# Patient Record
Sex: Male | Born: 1975 | Race: White | Hispanic: No | Marital: Single | Smoking: Never smoker
Health system: Southern US, Community
[De-identification: ages and names within clinical notes are randomized; demographics above are authoritative.]

## PROBLEM LIST (undated history)

## (undated) DIAGNOSIS — B351 Tinea unguium: Secondary | ICD-10-CM

## (undated) HISTORY — PX: OTHER SURGICAL HISTORY: SHX169

---

## 2004-06-02 ENCOUNTER — Emergency Department (HOSPITAL_COMMUNITY): Admission: EM | Admit: 2004-06-02 | Discharge: 2004-06-02 | Payer: Self-pay | Admitting: Emergency Medicine

## 2004-09-07 ENCOUNTER — Emergency Department (HOSPITAL_COMMUNITY): Admission: EM | Admit: 2004-09-07 | Discharge: 2004-09-07 | Payer: Self-pay | Admitting: Emergency Medicine

## 2004-10-31 ENCOUNTER — Emergency Department (HOSPITAL_COMMUNITY): Admission: EM | Admit: 2004-10-31 | Discharge: 2004-10-31 | Payer: Self-pay | Admitting: Emergency Medicine

## 2005-06-24 ENCOUNTER — Emergency Department (HOSPITAL_COMMUNITY): Admission: EM | Admit: 2005-06-24 | Discharge: 2005-06-24 | Payer: Self-pay | Admitting: Emergency Medicine

## 2005-07-23 ENCOUNTER — Emergency Department (HOSPITAL_COMMUNITY): Admission: EM | Admit: 2005-07-23 | Discharge: 2005-07-23 | Payer: Self-pay | Admitting: Emergency Medicine

## 2005-07-26 ENCOUNTER — Emergency Department (HOSPITAL_COMMUNITY): Admission: EM | Admit: 2005-07-26 | Discharge: 2005-07-26 | Payer: Self-pay | Admitting: *Deleted

## 2005-12-04 ENCOUNTER — Emergency Department (HOSPITAL_COMMUNITY): Admission: EM | Admit: 2005-12-04 | Discharge: 2005-12-04 | Payer: Self-pay | Admitting: Emergency Medicine

## 2006-01-27 ENCOUNTER — Emergency Department (HOSPITAL_COMMUNITY): Admission: EM | Admit: 2006-01-27 | Discharge: 2006-01-27 | Payer: Self-pay | Admitting: Emergency Medicine

## 2006-05-14 ENCOUNTER — Emergency Department (HOSPITAL_COMMUNITY): Admission: EM | Admit: 2006-05-14 | Discharge: 2006-05-14 | Payer: Self-pay | Admitting: Emergency Medicine

## 2006-12-06 ENCOUNTER — Emergency Department (HOSPITAL_COMMUNITY): Admission: EM | Admit: 2006-12-06 | Discharge: 2006-12-06 | Payer: Self-pay | Admitting: Emergency Medicine

## 2007-03-27 ENCOUNTER — Emergency Department (HOSPITAL_COMMUNITY): Admission: EM | Admit: 2007-03-27 | Discharge: 2007-03-27 | Payer: Self-pay | Admitting: Emergency Medicine

## 2007-03-30 ENCOUNTER — Emergency Department (HOSPITAL_COMMUNITY): Admission: EM | Admit: 2007-03-30 | Discharge: 2007-03-30 | Payer: Self-pay | Admitting: Emergency Medicine

## 2007-06-06 ENCOUNTER — Emergency Department (HOSPITAL_COMMUNITY): Admission: EM | Admit: 2007-06-06 | Discharge: 2007-06-06 | Payer: Self-pay | Admitting: Family Medicine

## 2007-06-28 ENCOUNTER — Emergency Department (HOSPITAL_COMMUNITY): Admission: EM | Admit: 2007-06-28 | Discharge: 2007-06-28 | Payer: Self-pay

## 2008-08-19 ENCOUNTER — Ambulatory Visit: Payer: Self-pay | Admitting: Family Medicine

## 2008-08-19 DIAGNOSIS — R631 Polydipsia: Secondary | ICD-10-CM

## 2008-08-19 DIAGNOSIS — B351 Tinea unguium: Secondary | ICD-10-CM

## 2008-08-19 HISTORY — DX: Tinea unguium: B35.1

## 2008-08-20 LAB — CONVERTED CEMR LAB
Alkaline Phosphatase: 60 units/L (ref 39–117)
Bilirubin, Direct: 0 mg/dL (ref 0.0–0.3)
Total Protein: 6.9 g/dL (ref 6.0–8.3)

## 2008-10-17 ENCOUNTER — Emergency Department (HOSPITAL_COMMUNITY): Admission: EM | Admit: 2008-10-17 | Discharge: 2008-10-17 | Payer: Self-pay | Admitting: Emergency Medicine

## 2009-08-22 ENCOUNTER — Emergency Department (HOSPITAL_COMMUNITY): Admission: EM | Admit: 2009-08-22 | Discharge: 2009-08-22 | Payer: Self-pay | Admitting: Emergency Medicine

## 2010-04-05 ENCOUNTER — Emergency Department (HOSPITAL_COMMUNITY)
Admission: EM | Admit: 2010-04-05 | Discharge: 2010-04-05 | Disposition: A | Discharge status: Home / Self Care | Payer: 59 | Attending: Emergency Medicine | Admitting: Emergency Medicine

## 2010-04-05 DIAGNOSIS — R059 Cough, unspecified: Secondary | ICD-10-CM | POA: Insufficient documentation

## 2010-04-05 DIAGNOSIS — H60399 Other infective otitis externa, unspecified ear: Secondary | ICD-10-CM | POA: Insufficient documentation

## 2010-04-05 DIAGNOSIS — H6690 Otitis media, unspecified, unspecified ear: Secondary | ICD-10-CM | POA: Insufficient documentation

## 2010-04-05 DIAGNOSIS — H9209 Otalgia, unspecified ear: Secondary | ICD-10-CM | POA: Insufficient documentation

## 2010-04-05 DIAGNOSIS — R05 Cough: Secondary | ICD-10-CM | POA: Insufficient documentation

## 2010-04-24 ENCOUNTER — Emergency Department (HOSPITAL_COMMUNITY)
Admission: EM | Admit: 2010-04-24 | Discharge: 2010-04-24 | Disposition: A | Discharge status: Home / Self Care | Payer: 59 | Attending: Emergency Medicine | Admitting: Emergency Medicine

## 2010-04-24 ENCOUNTER — Inpatient Hospital Stay (INDEPENDENT_AMBULATORY_CARE_PROVIDER_SITE_OTHER)
Admission: RE | Admit: 2010-04-24 | Discharge: 2010-04-24 | Disposition: A | Discharge status: Home / Self Care | Payer: 59 | Source: Ambulatory Visit | Attending: Family Medicine | Admitting: Family Medicine

## 2010-04-24 DIAGNOSIS — R55 Syncope and collapse: Secondary | ICD-10-CM | POA: Insufficient documentation

## 2010-04-24 DIAGNOSIS — R42 Dizziness and giddiness: Secondary | ICD-10-CM | POA: Insufficient documentation

## 2010-04-24 LAB — POCT I-STAT, CHEM 8
BUN: 14 mg/dL (ref 6–23)
Calcium, Ion: 1.11 mmol/L — ABNORMAL LOW (ref 1.12–1.32)
Chloride: 104 mEq/L (ref 96–112)
Glucose, Bld: 98 mg/dL (ref 70–99)

## 2010-04-24 LAB — CBC
Hemoglobin: 15.6 g/dL (ref 13.0–17.0)
MCH: 32.1 pg (ref 26.0–34.0)
MCV: 91.8 fL (ref 78.0–100.0)
RBC: 4.86 MIL/uL (ref 4.22–5.81)

## 2010-04-24 LAB — URINALYSIS, ROUTINE W REFLEX MICROSCOPIC
Bilirubin Urine: NEGATIVE
Glucose, UA: NEGATIVE mg/dL
Ketones, ur: NEGATIVE mg/dL
pH: 7.5 (ref 5.0–8.0)

## 2010-12-01 LAB — POCT CARDIAC MARKERS
Myoglobin, poc: 75.7
Troponin i, poc: <0.05

## 2010-12-01 LAB — BASIC METABOLIC PANEL
BUN: 10
CO2: 29
Chloride: 105
Creatinine, Ser: 1.18
GFR calc Af Amer: >60

## 2012-02-02 ENCOUNTER — Ambulatory Visit (INDEPENDENT_AMBULATORY_CARE_PROVIDER_SITE_OTHER): Payer: 59 | Admitting: Family Medicine

## 2012-02-02 ENCOUNTER — Ambulatory Visit: Payer: 59

## 2012-02-02 VITALS — BP 118/74 | HR 67 | Temp 97.9°F | Resp 16 | Ht 68.0 in | Wt 173.0 lb

## 2012-02-02 DIAGNOSIS — R202 Paresthesia of skin: Secondary | ICD-10-CM

## 2012-02-02 DIAGNOSIS — R209 Unspecified disturbances of skin sensation: Secondary | ICD-10-CM

## 2012-02-02 DIAGNOSIS — M549 Dorsalgia, unspecified: Secondary | ICD-10-CM

## 2012-02-02 MED ORDER — IBUPROFEN 600 MG PO TABS: 600.0000 mg | ORAL_TABLET | Freq: Three times a day (TID) | ORAL | Status: DC | PRN

## 2012-02-02 MED ORDER — CYCLOBENZAPRINE HCL 10 MG PO TABS: 10.0000 mg | ORAL_TABLET | Freq: Every evening | ORAL | Status: DC | PRN

## 2012-02-02 MED ORDER — TRAMADOL HCL 50 MG PO TABS: 50.0000 mg | ORAL_TABLET | Freq: Three times a day (TID) | ORAL | Status: DC | PRN

## 2012-02-02 MED ORDER — METHYLPREDNISOLONE 4 MG PO KIT: PACK | ORAL | Status: DC
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
## 2012-02-05 ENCOUNTER — Telehealth: Payer: Self-pay | Admitting: Radiology

## 2012-02-05 NOTE — Telephone Encounter (Signed)
Message copied by Marinus Maw on Sun Feb 05, 2012 11:08 AM ------      Message from: Lenell Antu      Created: Fri Feb 03, 2012  2:23 PM       Can you call the patient to let him know that the area on his hip xrays I was concerned with is normal, His xrays are normal.             Thx,      Dr. Conley Rolls

## 2012-02-05 NOTE — Telephone Encounter (Signed)
Spoke to pt and gave him xray results.

## 2012-02-05 NOTE — Telephone Encounter (Signed)
Left message for pt to call back  °

## 2012-02-06 ENCOUNTER — Telehealth: Payer: Self-pay | Admitting: Radiology

## 2012-02-06 NOTE — Telephone Encounter (Signed)
Message copied by Marinus Maw on Mon Feb 06, 2012 10:49 AM ------      Message from: Lenell Antu      Created: Fri Feb 03, 2012  2:23 PM       Can you call the patient to let him know that the area on his hip xrays I was concerned with is normal, His xrays are normal.             Thx,      Dr. Conley Rolls

## 2012-02-06 NOTE — Telephone Encounter (Signed)
Spoke to pt yesterday and gave xray reports. He understood.

## 2012-02-14 ENCOUNTER — Encounter (HOSPITAL_COMMUNITY): Payer: Self-pay | Admitting: *Deleted

## 2012-02-14 ENCOUNTER — Emergency Department (HOSPITAL_COMMUNITY)
Admission: EM | Admit: 2012-02-14 | Discharge: 2012-02-14 | Disposition: A | Discharge status: Home / Self Care | Payer: 59 | Attending: Emergency Medicine | Admitting: Emergency Medicine

## 2012-02-14 DIAGNOSIS — M5416 Radiculopathy, lumbar region: Secondary | ICD-10-CM

## 2012-02-14 DIAGNOSIS — IMO0002 Reserved for concepts with insufficient information to code with codable children: Secondary | ICD-10-CM | POA: Insufficient documentation

## 2012-02-14 MED ORDER — HYDROCODONE-ACETAMINOPHEN 5-325 MG PO TABS: 1.0000 | ORAL_TABLET | Freq: Four times a day (QID) | ORAL | Status: DC | PRN

## 2012-02-14 MED ORDER — HYDROMORPHONE HCL PF 2 MG/ML IJ SOLN
2.0000 mg | Freq: Once | INTRAMUSCULAR | Status: AC
  Administered 2012-02-14: 2 mg via INTRAMUSCULAR
  Filled 2012-02-14: qty 1

## 2012-02-14 MED ORDER — KETOROLAC TROMETHAMINE 30 MG/ML IJ SOLN
30.0000 mg | Freq: Once | INTRAMUSCULAR | Status: AC
  Administered 2012-02-14: 30 mg via INTRAMUSCULAR
  Filled 2012-02-14: qty 1

## 2012-02-14 MED ORDER — CYCLOBENZAPRINE HCL 10 MG PO TABS: 10.0000 mg | ORAL_TABLET | Freq: Two times a day (BID) | ORAL | Status: DC | PRN

## 2012-02-14 MED ORDER — METHYLPREDNISOLONE 4 MG PO KIT: PACK | ORAL | Status: DC

## 2012-02-14 MED ORDER — ONDANSETRON 4 MG PO TBDP
4.0000 mg | ORAL_TABLET | Freq: Once | ORAL | Status: AC
  Administered 2012-02-14: 4 mg via ORAL
  Filled 2012-02-14: qty 1

## 2012-02-14 MED ORDER — DIPHENHYDRAMINE HCL 25 MG PO CAPS
50.0000 mg | ORAL_CAPSULE | Freq: Once | ORAL | Status: AC
  Administered 2012-02-14: 50 mg via ORAL
  Filled 2012-02-14: qty 2

## 2012-02-14 NOTE — ED Notes (Signed)
Hives noted to right side of abdomen and right arm. Airway intact.
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
## 2012-02-14 NOTE — ED Notes (Signed)
Pt is here with lower back pain that has been bothering him for a few weeks and hurts with movement.

## 2012-09-04 ENCOUNTER — Emergency Department (HOSPITAL_COMMUNITY)
Admission: EM | Admit: 2012-09-04 | Discharge: 2012-09-04 | Disposition: A | Discharge status: Home / Self Care | Payer: 59 | Attending: Emergency Medicine | Admitting: Emergency Medicine

## 2012-09-04 ENCOUNTER — Emergency Department (HOSPITAL_COMMUNITY): Payer: 59

## 2012-09-04 ENCOUNTER — Encounter (HOSPITAL_COMMUNITY): Payer: Self-pay | Admitting: Emergency Medicine

## 2012-09-04 DIAGNOSIS — Z23 Encounter for immunization: Secondary | ICD-10-CM | POA: Insufficient documentation

## 2012-09-04 DIAGNOSIS — T07XXXA Unspecified multiple injuries, initial encounter: Secondary | ICD-10-CM

## 2012-09-04 DIAGNOSIS — S0990XA Unspecified injury of head, initial encounter: Secondary | ICD-10-CM | POA: Insufficient documentation

## 2012-09-04 DIAGNOSIS — IMO0002 Reserved for concepts with insufficient information to code with codable children: Secondary | ICD-10-CM | POA: Insufficient documentation

## 2012-09-04 DIAGNOSIS — S01309A Unspecified open wound of unspecified ear, initial encounter: Secondary | ICD-10-CM | POA: Insufficient documentation

## 2012-09-04 DIAGNOSIS — S0180XA Unspecified open wound of other part of head, initial encounter: Secondary | ICD-10-CM | POA: Insufficient documentation

## 2012-09-04 DIAGNOSIS — S0010XA Contusion of unspecified eyelid and periocular area, initial encounter: Secondary | ICD-10-CM | POA: Insufficient documentation

## 2012-09-04 MED ORDER — HYDROCODONE-ACETAMINOPHEN 5-325 MG PO TABS: 1.0000 | ORAL_TABLET | Freq: Four times a day (QID) | ORAL | Status: DC | PRN

## 2012-09-04 MED ORDER — IBUPROFEN 800 MG PO TABS: 800.0000 mg | ORAL_TABLET | Freq: Three times a day (TID) | ORAL | Status: DC

## 2012-09-04 MED ORDER — CEPHALEXIN 500 MG PO CAPS: 500.0000 mg | ORAL_CAPSULE | Freq: Four times a day (QID) | ORAL | Status: DC

## 2012-09-04 MED ORDER — IBUPROFEN 800 MG PO TABS
800.0000 mg | ORAL_TABLET | Freq: Once | ORAL | Status: AC
  Administered 2012-09-04: 800 mg via ORAL
  Filled 2012-09-04: qty 1

## 2012-09-04 MED ORDER — TETANUS-DIPHTH-ACELL PERTUSSIS 5-2.5-18.5 LF-MCG/0.5 IM SUSP
0.5000 mL | Freq: Once | INTRAMUSCULAR | Status: AC
  Administered 2012-09-04: 0.5 mL via INTRAMUSCULAR
  Filled 2012-09-04: qty 0.5
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
## 2012-09-04 NOTE — ED Notes (Signed)
Patient transported to CT 
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
## 2012-09-10 ENCOUNTER — Encounter (HOSPITAL_COMMUNITY): Payer: Self-pay | Admitting: *Deleted

## 2012-09-10 ENCOUNTER — Emergency Department (HOSPITAL_COMMUNITY)
Admission: EM | Admit: 2012-09-10 | Discharge: 2012-09-10 | Disposition: A | Discharge status: Home / Self Care | Payer: 59 | Attending: Emergency Medicine | Admitting: Emergency Medicine

## 2012-09-10 DIAGNOSIS — Z791 Long term (current) use of non-steroidal anti-inflammatories (NSAID): Secondary | ICD-10-CM | POA: Insufficient documentation

## 2012-09-10 DIAGNOSIS — Z79899 Other long term (current) drug therapy: Secondary | ICD-10-CM | POA: Insufficient documentation

## 2012-09-10 DIAGNOSIS — Z4802 Encounter for removal of sutures: Secondary | ICD-10-CM | POA: Insufficient documentation

## 2012-09-10 MED ORDER — HYDROCODONE-ACETAMINOPHEN 5-325 MG PO TABS: 1.0000 | ORAL_TABLET | ORAL | Status: DC | PRN

## 2012-09-10 MED ORDER — IBUPROFEN 800 MG PO TABS: 800.0000 mg | ORAL_TABLET | Freq: Three times a day (TID) | ORAL | Status: DC
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
## 2012-09-10 NOTE — ED Provider Notes (Signed)
Medical screening examination/treatment/procedure(s) were performed by non-physician practitioner and as supervising physician I was immediately available for consultation/collaboration.   Ashby Dawes, MD 09/10/12 [phone]
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
## 2012-11-14 ENCOUNTER — Ambulatory Visit (INDEPENDENT_AMBULATORY_CARE_PROVIDER_SITE_OTHER): Payer: 59 | Admitting: Family Medicine

## 2012-11-14 VITALS — BP 120/78 | HR 80 | Temp 100.3°F | Resp 18 | Ht 68.5 in | Wt 166.0 lb

## 2012-11-14 DIAGNOSIS — S0512XD Contusion of eyeball and orbital tissues, left eye, subsequent encounter: Secondary | ICD-10-CM

## 2012-11-14 DIAGNOSIS — J189 Pneumonia, unspecified organism: Secondary | ICD-10-CM

## 2012-11-14 DIAGNOSIS — Z5189 Encounter for other specified aftercare: Secondary | ICD-10-CM

## 2012-11-14 MED ORDER — AMOXICILLIN 500 MG PO CAPS: 1000.0000 mg | ORAL_CAPSULE | Freq: Two times a day (BID) | ORAL | Status: DC
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
## 2012-11-14 NOTE — Patient Instructions (Addendum)
Thank you for coming in today  For your orbital contusion, I think that this will continue to improve. Based on the degree of trauma I suspect this is still resolving hematoma and healing bone bruise.  For your cough and fever, I suspect you are developing a pneumonia. We will treat you with amoxicillin. Please stay out of work until 24 hours after your fever resolves.  Pneumonia, Adult Pneumonia is an infection of the lungs.  CAUSES Pneumonia may be caused by bacteria or a virus. Usually, these infections are caused by breathing infectious particles into the lungs (respiratory tract). SYMPTOMS   Cough.  Fever.  Chest pain.  Increased rate of breathing.  Wheezing.  Mucus production. DIAGNOSIS  If you have the common symptoms of pneumonia, your caregiver will typically confirm the diagnosis with a chest X-ray. The X-ray will show an abnormality in the lung (pulmonary infiltrate) if you have pneumonia. Other tests of your blood, urine, or sputum may be done to find the specific cause of your pneumonia. Your caregiver may also do tests (blood gases or pulse oximetry) to see how well your lungs are working. TREATMENT  Some forms of pneumonia may be spread to other people when you cough or sneeze. You may be asked to wear a mask before and during your exam. Pneumonia that is caused by bacteria is treated with antibiotic medicine. Pneumonia that is caused by the influenza virus may be treated with an antiviral medicine. Most other viral infections must run their course. These infections will not respond to antibiotics.  PREVENTION A pneumococcal shot (vaccine) is available to prevent a common bacterial cause of pneumonia. This is usually suggested for:  People over 70 years old.  Patients on chemotherapy.  People with chronic lung problems, such as bronchitis or emphysema.  People with immune system problems. If you are over 65 or have a high risk condition, you may receive the  pneumococcal vaccine if you have not received it before. In some countries, a routine influenza vaccine is also recommended. This vaccine can help prevent some cases of pneumonia.You may be offered the influenza vaccine as part of your care. If you smoke, it is time to quit. You may receive instructions on how to stop smoking. Your caregiver can provide medicines and counseling to help you quit. HOME CARE INSTRUCTIONS   Cough suppressants may be used if you are losing too much rest. However, coughing protects you by clearing your lungs. You should avoid using cough suppressants if you can.  Your caregiver may have prescribed medicine if he or she thinks your pneumonia is caused by a bacteria or influenza. Finish your medicine even if you start to feel better.  Your caregiver may also prescribe an expectorant. This loosens the mucus to be coughed up.  Only take over-the-counter or prescription medicines for pain, discomfort, or fever as directed by your caregiver.  Do not smoke. Smoking is a common cause of bronchitis and can contribute to pneumonia. If you are a smoker and continue to smoke, your cough may last several weeks after your pneumonia has cleared.  A cold steam vaporizer or humidifier in your room or home may help loosen mucus.  Coughing is often worse at night. Sleeping in a semi-upright position in a recliner or using a couple pillows under your head will help with this.  Get rest as you feel it is needed. Your body will usually let you know when you need to rest. SEEK IMMEDIATE MEDICAL CARE IF:  Your illness becomes worse. This is especially true if you are elderly or weakened from any other disease.  You cannot control your cough with suppressants and are losing sleep.  You begin coughing up blood.  You develop pain which is getting worse or is uncontrolled with medicines.  You have a fever.  Any of the symptoms which initially brought you in for treatment are getting  worse rather than better.  You develop shortness of breath or chest pain. MAKE SURE YOU:   Understand these instructions.  Will watch your condition.  Will get help right away if you are not doing well or get worse. Document Released: 02/07/2005 Document Revised: 05/02/2011 Document Reviewed: 04/29/2010 [facility] Patient Information 2014 [place], [place].

## 2012-11-15 NOTE — Progress Notes (Signed)
Patient discussed with Dr. Voss. Agree with assessment and plan of care per her note.   

## 2012-11-22 ENCOUNTER — Telehealth: Payer: Self-pay

## 2012-11-22 NOTE — Telephone Encounter (Signed)
Patient is calling to check the status of getting his medical records.   [phone]

## 2012-11-23 NOTE — Telephone Encounter (Signed)
Records faxed to patient on 11/22/12 as he requested. If he needs anything else he was instructed to call me back.

## 2013-03-27 ENCOUNTER — Emergency Department (HOSPITAL_COMMUNITY)
Admission: EM | Admit: 2013-03-27 | Discharge: 2013-03-27 | Disposition: A | Discharge status: Home / Self Care | Payer: 59 | Attending: Emergency Medicine | Admitting: Emergency Medicine

## 2013-03-27 ENCOUNTER — Encounter (HOSPITAL_COMMUNITY): Payer: Self-pay | Admitting: Emergency Medicine

## 2013-03-27 DIAGNOSIS — H53149 Visual discomfort, unspecified: Secondary | ICD-10-CM | POA: Insufficient documentation

## 2013-03-27 DIAGNOSIS — H109 Unspecified conjunctivitis: Secondary | ICD-10-CM

## 2013-03-27 MED ORDER — TETRACAINE HCL 0.5 % OP SOLN
2.0000 [drp] | Freq: Once | OPHTHALMIC | Status: AC
  Administered 2013-03-27: 2 [drp] via OPHTHALMIC
  Filled 2013-03-27: qty 2

## 2013-03-27 MED ORDER — CIPROFLOXACIN HCL 0.3 % OP SOLN
2.0000 [drp] | OPHTHALMIC | Status: DC
  Administered 2013-03-27: 2 [drp] via OPHTHALMIC
  Filled 2013-03-27: qty 2.5

## 2013-03-27 NOTE — ED Notes (Signed)
Pt reports he wears contacts, used his gfs contact case and solution. Monday wore contacts and felt like he had a rip in contact of right eye. Tuesday he did not wear contacts, right eye very red. At present reddness in right eye only. Reports intermittment pain 5/10. States "feels like something is in the corner" inner canthus.

## 2013-03-27 NOTE — Discharge Instructions (Signed)

## 2013-03-27 NOTE — ED Notes (Signed)
Pt normally wears contacts but does not have them in at this time because of irritation to R eye.

## 2013-03-27 NOTE — ED Provider Notes (Signed)
CSN: [id number]     Arrival date & time 03/27/13  1727 History  This chart was scribed for non-physician practitioner, Izola Price. Marisue Humble, PA-C working with Shon Baton, MD by Greggory Stallion, ED scribe. This patient was seen in room WTR7/WTR7 and the patient's care was started at 6:28 PM.   Chief Complaint  Patient presents with  . Eye Pain   The history is provided by the patient. No language interpreter was used.   HPI Comments: Kevin Meyers is a 38 y.o. male who presents to the Emergency Department complaining of gradual onset, intermittent right eye pain and constant redness that started yesterday. Pt also has photophobia. He states he used his girlfriend's contact case and solution to put his contacts in 2 days ago and states the right contact looked ripped. Pt has not put the contacts back in since 2 days ago. He states his eye was really swollen yesterday but it was relieved after sitting in the dark for several hours. Pt states it still feels like something is in the corner of his eye.   History reviewed. No pertinent past medical history. History reviewed. No pertinent past surgical history. Family History  Problem Relation Age of Onset  . Diabetes Mother   . Hypertension Father    History  Substance Use Topics  . Smoking status: Never Smoker   . Smokeless tobacco: Not on file  . Alcohol Use: Yes    Review of Systems  Eyes: Positive for photophobia, pain and redness.  All other systems reviewed and are negative.   Allergies  Review of patient's allergies indicates no known allergies.  Home Medications  No current outpatient prescriptions on file.  BP 147/96  Pulse 70  Temp(Src) 99.1 F (37.3 C) (Oral)  Resp 16  SpO2 95%  Physical Exam  Nursing note and vitals reviewed. Constitutional: He is oriented to person, place, and time. He appears well-developed and well-nourished. No distress.  HENT:  Head: Normocephalic and atraumatic.  Mouth/Throat:  Oropharynx is clear and moist. No oropharyngeal exudate.  Eyes: EOM are normal. Pupils are equal, round, and reactive to light. Right eye exhibits no chemosis and no discharge. Left eye exhibits no chemosis and no discharge. Right conjunctiva is injected. Right conjunctiva has no hemorrhage. Left conjunctiva is not injected. Left conjunctiva has no hemorrhage. No scleral icterus. Right eye exhibits normal extraocular motion and no nystagmus. Left eye exhibits normal extraocular motion and no nystagmus.  Slit lamp exam:      The right eye shows no corneal abrasion, no corneal flare, no corneal ulcer, no fluorescein uptake and no anterior chamber bulge.       The left eye shows no corneal abrasion, no corneal flare, no corneal ulcer, no fluorescein uptake and no anterior chamber bulge.  IOP in right eye 2/1  Pulmonary/Chest: Effort normal.  Musculoskeletal: Normal range of motion. He exhibits no edema and no tenderness.  Neurological: He is alert and oriented to person, place, and time. He exhibits normal muscle tone. Coordination normal.  Skin: Skin is warm and dry. No rash noted. No erythema. No pallor.  Psychiatric: He has a normal mood and affect. His behavior is normal. Judgment and thought content normal.    ED Course  Procedures (including critical care time)  DIAGNOSTIC STUDIES: Oxygen Saturation is 95% on RA, adequate by my interpretation.    COORDINATION OF CARE: 6:30 PM-Discussed treatment plan which includes check for corneal abrasion or ulcer and checking eye pressure with pt  at bedside and pt agreed to plan.  6:36 PM-Advised pt that his symptoms are consistent with conjunctivitis and he will be given eye drops. Pt agrees with plan.    Labs Review Labs Reviewed - No data to display Imaging Review No results found.  EKG Interpretation   None       MDM  Conjunctivitis  Patient here with right eye pain and redness after using someone else's contact case, no evidence of  abrasion, acute narrow angle glaucoma, started on cipro drops - will be following up with eye doctor tomorrow  I personally performed the services described in this documentation, which was scribed in my presence. The recorded information has been reviewed and is accurate.   Izola PriceFrances C. Marisue HumbleSanford, New JerseyPA-C 03/27/13 1932

## 2013-03-28 NOTE — ED Provider Notes (Signed)
Medical screening examination/treatment/procedure(s) were performed by non-physician practitioner and as supervising physician I was immediately available for consultation/collaboration.  EKG Interpretation   None         Staysha Truby F Twania Bujak, MD 03/28/13 0039 
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
## 2014-01-23 ENCOUNTER — Encounter (HOSPITAL_COMMUNITY): Payer: Self-pay

## 2014-01-23 ENCOUNTER — Emergency Department (HOSPITAL_COMMUNITY)
Admission: EM | Admit: 2014-01-23 | Discharge: 2014-01-23 | Disposition: A | Discharge status: Home / Self Care | Payer: 59 | Attending: Emergency Medicine | Admitting: Emergency Medicine

## 2014-01-23 DIAGNOSIS — N499 Inflammatory disorder of unspecified male genital organ: Secondary | ICD-10-CM | POA: Diagnosis present

## 2014-01-23 DIAGNOSIS — L739 Follicular disorder, unspecified: Secondary | ICD-10-CM | POA: Diagnosis not present

## 2014-01-23 MED ORDER — CEPHALEXIN 500 MG PO CAPS: 500.0000 mg | ORAL_CAPSULE | Freq: Two times a day (BID) | ORAL | Status: DC

## 2014-01-23 NOTE — ED Notes (Signed)
Pt presents with c/o possible ingrown hair/abscess under his waistband right above his pubis area. Pt reports some swelling and redness to the area, no drainage noted at this time.

## 2014-01-23 NOTE — ED Provider Notes (Signed)
CSN: [id number]     Arrival date & time 01/23/14  [id number] History  This chart was scribed for non-physician practitioner working with [name], MD by Richarda Overlieichard Holland, ED Scribe. This patient was seen in room WTR9/WTR9 and the patient's care was started at 8:36 PM.  Chief Complaint  Patient presents with  . Abscess    The history is provided by the patient. No language interpreter was used.   HPI Comments: Kevin Meyers is a 38 y.o. male who presents to the Emergency Department complaining of a worsening abscess under his waistband right above his pubis area that started 5 days ago. Pt reports associated redness and says the area does not hurt unless he touches it. He states he thought it was an ingrown hair and says he has been squeezing and tweezing the area. Pt states it "feels like there is a lump." Pt states he has tried nothing else for the abscess. No other modifying factors He denies fever, drainage, and difficulty urinating. He reports no NKDA.    History reviewed. No pertinent past medical history. History reviewed. No pertinent past surgical history. Family History  Problem Relation Age of Onset  . Diabetes Mother   . Hypertension Father    History  Substance Use Topics  . Smoking status: Never Smoker   . Smokeless tobacco: Not on file  . Alcohol Use: Yes     Comment: 4 x a week     Review of Systems  Constitutional: Negative for fever.  Genitourinary: Negative for difficulty urinating.  Skin: Positive for color change and rash.  All other systems reviewed and are negative.   Allergies  Review of patient's allergies indicates no known allergies.  Home Medications   Prior to Admission medications   Medication Sig Start Date End Date Taking? Authorizing Provider  cephALEXin (KEFLEX) 500 MG capsule Take 1 capsule (500 mg total) by mouth 2 (two) times daily. 01/23/14   Earle GellBenjamin W Rydan Gulyas, PA-C  ibuprofen (ADVIL,MOTRIN) 200 MG tablet Take 800 mg by mouth every  6 (six) hours as needed for headache.    Historical Provider, MD   BP 132/93 mmHg  Pulse 75  Temp(Src) 98 F (36.7 C) (Oral)  Resp 20  SpO2 100% Physical Exam  Constitutional: He is oriented to person, place, and time. He appears well-developed and well-nourished.  HENT:  Head: Normocephalic and atraumatic.  Neck: Normal range of motion. Neck supple. No tracheal deviation present.  Cardiovascular: Normal rate, regular rhythm and normal heart sounds.   Pulmonary/Chest: Effort normal and breath sounds normal. No respiratory distress.  Abdominal: Soft. He exhibits no distension. There is no tenderness.  Genitourinary:     Pubic area --Small 1 cm circumferential lesion with mild erythema without drainage. No evidence of fluctuance or any drainable abscess.  Neurological: He is alert and oriented to person, place, and time.  Skin: Skin is warm and dry.  Psychiatric: He has a normal mood and affect.  Nursing note and vitals reviewed.   ED Course  Procedures   DIAGNOSTIC STUDIES: Oxygen Saturation is 100% on RA, normal by my interpretation.    COORDINATION OF CARE: 8:43 PM Discussed treatment plan with pt at bedside and pt agreed to plan.   Labs Review Labs Reviewed - No data to display  Imaging Review No results found.   EKG Interpretation None      MDM  Vitals stable - WNL -afebrile Pt resting comfortably in ED. PE--not concerning for other acute or emergent  pathology. On physical exam, area consistent with folliculitis  Will DC with antibiotic as well as symptomatic support with warm compresses. Discussed f/u with PCP and return precautions, pt very amenable to plan. Patient stable, in good condition and is appropriate for discharge.   Final diagnoses:  Folliculitis    I personally performed the services described in this documentation, which was scribed in my presence. The recorded information has been reviewed and is accurate.      Earle GellBenjamin W Sopertonartner,  PA-C 01/23/14 [id number]  [name], MD 01/24/14 [phone]

## 2014-01-23 NOTE — Discharge Instructions (Signed)

## 2014-09-02 ENCOUNTER — Ambulatory Visit (INDEPENDENT_AMBULATORY_CARE_PROVIDER_SITE_OTHER): Payer: Worker's Compensation | Admitting: Emergency Medicine

## 2014-09-02 VITALS — BP 130/86 | HR 81 | Temp 98.0°F | Resp 16 | Ht 68.0 in | Wt 174.4 lb

## 2014-09-02 DIAGNOSIS — S61011A Laceration without foreign body of right thumb without damage to nail, initial encounter: Secondary | ICD-10-CM

## 2014-09-02 DIAGNOSIS — M79644 Pain in right finger(s): Secondary | ICD-10-CM | POA: Diagnosis not present

## 2014-09-02 NOTE — Patient Instructions (Signed)

## 2014-09-02 NOTE — Progress Notes (Signed)
Subjective:  Patient ID: Kevin Meyers, male    DOB: 1975-09-01  Age: 39 y.o. MRN: [id number]  CC: Laceration   HPI Kevin Meyers presents  with an injury to his right thumb. 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 Current on tetanus. He has no other complaint. Of pain  History Kevin Meyers has no past medical history on file.   He has no past surgical history on file.   His  family history includes Diabetes in his mother; Hypertension in his father.  He   reports that he has never smoked. He does not have any smokeless tobacco history on file. He reports that he drinks alcohol. He reports that he does not use illicit drugs.  Outpatient Prescriptions Prior to Visit  Medication Sig Dispense Refill  . cephALEXin (KEFLEX) 500 MG capsule Take 1 capsule (500 mg total) by mouth 2 (two) times daily. (Patient not taking: Reported on 09/02/2014) 20 capsule 0  . ibuprofen (ADVIL,MOTRIN) 200 MG tablet Take 800 mg by mouth every 6 (six) hours as needed for headache.     No facility-administered medications prior to visit.    History   Social History  . Marital Status: Single    Spouse Name: N/A  . Number of Children: N/A  . Years of Education: N/A   Social History Main Topics  . Smoking status: Never Smoker   . Smokeless tobacco: Not on file  . Alcohol Use: Yes     Comment: 4 x a week   . Drug Use: No  . Sexual Activity: Not on file   Other Topics Concern  . None   Social History Narrative     Review of Systems  Constitutional: Negative for fever, chills and appetite change.  HENT: Negative for congestion, ear pain, postnasal drip, sinus pressure and sore throat.   Eyes: Negative for pain and redness.  Respiratory: Negative for cough, shortness of breath and wheezing.   Cardiovascular: Negative for leg swelling.  Gastrointestinal: Negative for nausea, vomiting, abdominal pain, diarrhea,  constipation and blood in stool.  Endocrine: Negative for polyuria.  Genitourinary: Negative for dysuria, urgency, frequency and flank pain.  Musculoskeletal: Negative for gait problem.  Skin: Negative for rash.  Neurological: Negative for weakness and headaches.  Psychiatric/Behavioral: Negative for confusion and decreased concentration. The patient is not nervous/anxious.     Objective:  BP 130/86 mmHg  Pulse 81  Temp(Src) 98 F (36.7 C) (Oral)  Resp 16  Ht 5\' 8"  (1.727 m)  Wt 174 lb 6.4 oz (79.107 kg)  BMI 26.52 kg/m2  SpO2 98%  Physical Exam  Constitutional: He is oriented to person, place, and time. He appears well-developed and well-nourished.  HENT:  Head: Normocephalic and atraumatic.  Eyes: Conjunctivae are normal. Pupils are equal, round, and reactive to light.  Pulmonary/Chest: Effort normal.  Musculoskeletal: He exhibits no edema.  Neurological: He is alert and oriented to person, place, and time.  Skin: Skin is dry.  Psychiatric: He has a normal mood and affect. His behavior is normal. Thought content normal.   Physical 1.5 cm laceration of the tip of his thumb. The nail and nailbed are not involved. Is no neurovascular tendon injury there is no visible foreign body.  Assessment & Plan:   Kevin Meyers was seen today for laceration.  Diagnoses and all orders for this visit:  Laceration of thumb, right, initial encounter  Pain  of finger of right hand   I have discontinued Mr. Hazen ibuprofen and cephALEXin.  No orders of the defined types were placed in this encounter.   Wound was prepped and draped single fashion and the wound infiltrated with 1% lidocaine. After suitable anesthesia was repaired in a single layer with simple interrupted sutures. He was instructed to come back in a week.  Appropriate red flag conditions were discussed with the patient as well as actions that should be taken.  Patient expressed his understanding.  Follow-up: Return in  about 1 week (around 09/09/2014).  Carmelina Dane, MD

## 2014-09-04 ENCOUNTER — Ambulatory Visit (INDEPENDENT_AMBULATORY_CARE_PROVIDER_SITE_OTHER): Payer: Worker's Compensation | Admitting: Physician Assistant

## 2014-09-04 VITALS — BP 118/80 | HR 77 | Temp 98.3°F | Resp 14 | Ht 68.0 in | Wt 174.0 lb

## 2014-09-04 DIAGNOSIS — S61011S Laceration without foreign body of right thumb without damage to nail, sequela: Secondary | ICD-10-CM | POA: Diagnosis not present

## 2014-09-04 MED ORDER — CEPHALEXIN 500 MG PO CAPS: 500.0000 mg | ORAL_CAPSULE | Freq: Two times a day (BID) | ORAL | Status: DC

## 2014-09-04 NOTE — Patient Instructions (Signed)

## 2014-09-04 NOTE — Progress Notes (Signed)
MRN: [id number] DOB: 27-Aug-1975  Subjective:  Pt presents to clinic with an injury that occurred at work on 09/02/2014. He cut himself on a clean box cutter and had stitches placed on 7/12.  Since then he has been working and the stitches have worked free.  He is having some pain associated with use of the finger.  He is right handed.  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  Review of Systems  Skin: Positive for wound.    Objective:  BP 118/80 mmHg  Pulse 77  Temp(Src) 98.3 F (36.8 C) (Oral)  Resp 14  Ht 5\' 8"  (1.727 m)  Wt 174 lb (78.926 kg)  BMI 26.46 kg/m2  SpO2 98%  Physical Exam  Constitutional: He is oriented to person, place, and time and well-developed, well-nourished, and in no distress.  HENT:  Head: Normocephalic and atraumatic.  Right Ear: External ear normal.  Left Ear: External ear normal.  Eyes: Conjunctivae are normal.  Neck: Normal range of motion.  Pulmonary/Chest: Effort normal.  Neurological: He is alert and oriented to person, place, and time. Gait normal.  Skin: Skin is warm and dry.  1.5 cm laceration on right thumb.  2 of the 3 stitches placed have come untied.  There is no erythema around the wound.  Psychiatric: Mood, memory, affect and judgment normal.   Procedure:  Consent obtained.  Local anesthesia obtained with 2% lido.  Wound cleaned.  Stitches were removed and 5-0 Ethilon #5 SI sutures were placed keeping the wound closed.  Drsg placed.  Assessment and Plan :  Thumb laceration, right, sequela - Plan: cephALEXin (KEFLEX) 500 MG capsule  Wound care d/w pt.  I did start him on Kelfex today because the stitches were replaced >24h after injury.  He will RTC in 7 days for suture removal.  Benny LennertSarah Weber PA-C  [facility] 09/04/2014 9:31 AM

## 2014-09-09 NOTE — Progress Notes (Signed)
  Medical screening examination/treatment/procedure(s) were performed by non-physician practitioner and as supervising physician I was immediately available for consultation/collaboration.     

## 2014-09-11 ENCOUNTER — Ambulatory Visit (INDEPENDENT_AMBULATORY_CARE_PROVIDER_SITE_OTHER): Payer: Worker's Compensation | Admitting: Family Medicine

## 2014-09-11 ENCOUNTER — Ambulatory Visit (INDEPENDENT_AMBULATORY_CARE_PROVIDER_SITE_OTHER): Payer: 59 | Admitting: Family Medicine

## 2014-09-11 VITALS — BP 110/68 | HR 89 | Temp 98.6°F | Resp 18 | Ht 69.0 in | Wt 174.2 lb

## 2014-09-11 DIAGNOSIS — J309 Allergic rhinitis, unspecified: Secondary | ICD-10-CM | POA: Diagnosis not present

## 2014-09-11 DIAGNOSIS — T8131XD Disruption of external operation (surgical) wound, not elsewhere classified, subsequent encounter: Secondary | ICD-10-CM

## 2014-09-11 DIAGNOSIS — J040 Acute laryngitis: Secondary | ICD-10-CM | POA: Diagnosis not present

## 2014-09-11 DIAGNOSIS — M79641 Pain in right hand: Secondary | ICD-10-CM

## 2014-09-11 DIAGNOSIS — S61011S Laceration without foreign body of right thumb without damage to nail, sequela: Secondary | ICD-10-CM | POA: Diagnosis not present

## 2014-09-11 MED ORDER — CETIRIZINE HCL 10 MG PO TABS: 10.0000 mg | ORAL_TABLET | Freq: Every day | ORAL | Status: AC

## 2014-09-11 MED ORDER — FLUTICASONE PROPIONATE 50 MCG/ACT NA SUSP: 2.0000 | Freq: Every day | NASAL | Status: DC

## 2014-09-11 NOTE — Patient Instructions (Signed)
Laryngitis At the top of your windpipe is your voice box. It is the source of your voice. Inside your voice box are 2 bands of muscles called vocal cords. When you breathe, your vocal cords are relaxed and open so that air can get into the lungs. When you decide to say something, these cords come together and vibrate. The sound from these vibrations goes into your throat and comes out through your mouth as sound. Laryngitis is an inflammation of the vocal cords that causes hoarseness, cough, loss of voice, sore throat, and dry throat. Laryngitis can be temporary (acute) or long-term (chronic). Most cases of acute laryngitis improve with time.Chronic laryngitis lasts for more than 3 weeks. CAUSES Laryngitis can often be related to excessive smoking, talking, or yelling, as well as inhalation of toxic fumes and allergies. Acute laryngitis is usually caused by a viral infection, vocal strain, measles or mumps, or bacterial infections. Chronic laryngitis is usually caused by vocal cord strain, vocal cord injury, postnasal drip, growths on the vocal cords, or acid reflux. SYMPTOMS   Cough.  Sore throat.  Dry throat. RISK FACTORS  Respiratory infections.  Exposure to irritating substances, such as cigarette smoke, excessive amounts of alcohol, stomach acids, and workplace chemicals.  Voice trauma, such as vocal cord injury from shouting or speaking too loud. DIAGNOSIS  Your cargiver will perform a physical exam. During the physical exam, your caregiver will examine your throat. The most common sign of laryngitis is hoarseness. Laryngoscopy may be necessary to confirm the diagnosis of this condition. This procedure allows your caregiver to look into the larynx. HOME CARE INSTRUCTIONS  Drink enough fluids to keep your urine clear or pale yellow.  Rest until you no longer have symptoms or as directed by your caregiver.  Breathe in moist air.  Take all medicine as directed by your  caregiver.  Do not smoke.  Talk as little as possible (this includes whispering).  Write on paper instead of talking until your voice is back to normal.  Follow up with your caregiver if your condition has not improved after 10 days. SEEK MEDICAL CARE IF:   You have trouble breathing.  You cough up blood.  You have persistent fever.  You have increasing pain.  You have difficulty swallowing. MAKE SURE YOU:  Understand these instructions.  Will watch your condition.  Will get help right away if you are not doing well or get worse. Document Released: 02/07/2005 Document Revised: 05/02/2011 Document Reviewed: 04/15/2010 [facility] Patient Information 2015 [place], [place]. This information is not intended to replace advice given to you by your health care provider. Make sure you discuss any questions you have with your health care provider.  Allergic Rhinitis Allergic rhinitis is when the mucous membranes in the nose respond to allergens. Allergens are particles in the air that cause your body to have an allergic reaction. This causes you to release allergic antibodies. Through a chain of events, these eventually cause you to release histamine into the blood stream. Although meant to protect the body, it is this release of histamine that causes your discomfort, such as frequent sneezing, congestion, and an itchy, runny nose.  CAUSES  Seasonal allergic rhinitis (hay fever) is caused by pollen allergens that may come from grasses, trees, and weeds. Year-round allergic rhinitis (perennial allergic rhinitis) is caused by allergens such as house dust mites, pet dander, and mold spores.  SYMPTOMS   Nasal stuffiness (congestion).  Itchy, runny nose with sneezing and tearing of the  eyes. DIAGNOSIS  Your health care provider can help you determine the allergen or allergens that trigger your symptoms. If you and your health care provider are unable to determine the allergen, skin or blood  testing may be used. TREATMENT  Allergic rhinitis does not have a cure, but it can be controlled by:  Medicines and allergy shots (immunotherapy).  Avoiding the allergen. Hay fever may often be treated with antihistamines in pill or nasal spray forms. Antihistamines block the effects of histamine. There are over-the-counter medicines that may help with nasal congestion and swelling around the eyes. Check with your health care provider before taking or giving this medicine.  If avoiding the allergen or the medicine prescribed do not work, there are many new medicines your health care provider can prescribe. Stronger medicine may be used if initial measures are ineffective. Desensitizing injections can be used if medicine and avoidance does not work. Desensitization is when a patient is given ongoing shots until the body becomes less sensitive to the allergen. Make sure you follow up with your health care provider if problems continue. HOME CARE INSTRUCTIONS It is not possible to completely avoid allergens, but you can reduce your symptoms by taking steps to limit your exposure to them. It helps to know exactly what you are allergic to so that you can avoid your specific triggers. SEEK MEDICAL CARE IF:   You have a fever.  You develop a cough that does not stop easily (persistent).  You have shortness of breath.  You start wheezing.  Symptoms interfere with normal daily activities. Document Released: 11/02/2000 Document Revised: 02/12/2013 Document Reviewed: 10/15/2012 [facility] Patient Information 2015 [place], [place]. This information is not intended to replace advice given to you by your health care provider. Make sure you discuss any questions you have with your health care provider.

## 2014-09-11 NOTE — Progress Notes (Signed)
Subjective:  This chart was scribed for Kevin Sorenson, MD by [facility], medical scribe at [facility].The patient was seen in exam room 12 and the patient's care was started at 9:56 AM.   Patient ID: Kevin Meyers, male    DOB: 1975-03-30, 39 y.o.   MRN: [id number] Chief Complaint  Patient presents with  . Sore Throat    x1 day   HPI  HPI Comments: Kevin Meyers is a 39 y.o. male who presents to [facility] complaining of hoarseness, mild sore throat and a dry cough, onset yesterday. He is not taking any OTC medications. Pt does not smoke and does not have seasonal allergies.   He is being seen here today for his WC f/u OV for finger lac as well.  History reviewed. No pertinent past medical history. Current Outpatient Prescriptions on File Prior to Visit  Medication Sig Dispense Refill  . cephALEXin (KEFLEX) 500 MG capsule Take 1 capsule (500 mg total) by mouth 2 (two) times daily. 14 capsule 0   No current facility-administered medications on file prior to visit.   No Known Allergies  Review of Systems  Constitutional: Negative for fever, chills, diaphoresis, activity change, appetite change, fatigue and unexpected weight change.  HENT: Positive for congestion, postnasal drip, sore throat and voice change. Negative for ear pain, mouth sores, rhinorrhea, sinus pressure, tinnitus and trouble swallowing.   Respiratory: Positive for cough. Negative for shortness of breath.   Cardiovascular: Negative for chest pain.  Gastrointestinal: Negative for nausea, vomiting, abdominal pain, diarrhea and constipation.  Genitourinary: Negative for dysuria.  Musculoskeletal: Negative for myalgias, arthralgias, neck pain and neck stiffness.  Skin: Positive for wound. Negative for color change and rash.  Neurological: Negative for syncope.  Hematological: Negative for adenopathy.  Psychiatric/Behavioral: Negative for sleep disturbance.        Objective:  BP 110/68 mmHg  Pulse 89  Temp(Src) 98.6 F (37 C) (Oral)  Resp 18  Ht 5\' 9"  (1.753 m)  Wt 174 lb 3.2 oz (79.017 kg)  BMI 25.71 kg/m2  SpO2 97% Physical Exam  Constitutional: He is oriented to person, place, and time. He appears well-developed and well-nourished. No distress.  HENT:  Head: Normocephalic and atraumatic.  Right Ear: Tympanic membrane is injected.  Left Ear: Tympanic membrane is not injected.  Nose: Mucosal edema present.  Mild oropharynx erythema right worse than left. Nasal mucosal edema on the right  Eyes: Pupils are equal, round, and reactive to light.  Neck: Normal range of motion. No thyromegaly present.  Cardiovascular: Normal rate, regular rhythm, S1 normal, S2 normal and normal heart sounds.   Pulmonary/Chest: Effort normal and breath sounds normal. No respiratory distress.  Musculoskeletal: Normal range of motion.  Lymphadenopathy:    He has no cervical adenopathy.  Neurological: He is alert and oriented to person, place, and time.  Skin: Skin is warm and dry.  Psychiatric: He has a normal mood and affect. His behavior is normal.  Nursing note and vitals reviewed.     Assessment & Plan:   1. Allergic rhinitis, unspecified allergic rhinitis type   2. Laryngitis, acute   Suspect viral vs pnd from seasonal allergies. No signs of bacterial infxn on exam today.  Meds ordered this encounter  Medications  . fluticasone (FLONASE) 50 MCG/ACT nasal spray    Sig: Place 2 sprays into both nostrils at bedtime.    Dispense:  16 g    Refill:  2  . cetirizine (ZYRTEC) 10 MG tablet    Sig: Take 1 tablet (10 mg total) by mouth at bedtime.    Dispense:  30 tablet    Refill:  11    I personally performed the services described in this documentation, which was scribed in my presence. The recorded information has been reviewed and considered, and addended by me as needed.  Kevin Sorenson, MD MPH

## 2014-09-11 NOTE — Progress Notes (Signed)
   Subjective:  This chart was scribed for Kevin Sorenson, MD by [facility], medical scribe at [facility].The patient was seen in exam room 12 and the patient's care was started at 9:03 AM.   Patient ID: Kevin Meyers, male    DOB: October 16, 1975, 39 y.o.   MRN: [id number] Chief Complaint  Patient presents with  . Suture / Staple Removal   HPI HPI Comments: Kevin Meyers is a 39 y.o. male who presents to [facility] for a suture removal. He was seen in the clinic nine days ago when he cut himself on a razorblade 30 min prior to arrival on his right distal first phalanx not into nail bed. He returned to work within two days the suture were untied and the wound was bleeding. He returned to the clinic and the sutures were replaced and pt was placed on keflex. Since then he reports his wound will continue bleeding if he hits it against anything and this happens several times daily. He works in a [employer] and has been unable to cover the wound as the bandage will immediately come off with the heavy lifting he does. He also has several children, and does frequent handwashing which does not help with the closing of the wound. He noticed sutures have again been loosening again, they have not aided in keeping the wound tight together.   Review of Systems  Constitutional: Negative for fever, chills, activity change and appetite change.  Cardiovascular: Negative for leg swelling.  Gastrointestinal: Negative for vomiting and abdominal pain.  Musculoskeletal: Negative for myalgias, joint swelling, arthralgias and gait problem.  Skin: Positive for rash and wound.  Neurological: Negative for weakness and numbness.  Hematological: Negative for adenopathy. Does not bruise/bleed easily.      Objective:  BP 110/68 mmHg  Pulse 89  Temp(Src) 98.6 F (37 C) (Oral)  Resp 18  Ht  (1.753 m)  Wt 174 lb 3.2 oz (79.017 kg)  BMI 25.71 kg/m2  SpO2 97% Physical Exam    Constitutional: He is oriented to person, place, and time. He appears well-developed and well-nourished. No distress.  HENT:  Head: Normocephalic and atraumatic.  Eyes: Pupils are equal, round, and reactive to light.  Neck: Normal range of motion.  Cardiovascular: Normal rate and regular rhythm.   Pulmonary/Chest: Effort normal. No respiratory distress.  Musculoskeletal: Normal range of motion.  Neurological: He is alert and oriented to person, place, and time.  Skin: Skin is warm and dry.  Psychiatric: He has a normal mood and affect. His behavior is normal.  Nursing note and vitals reviewed.     Assessment & Plan:   1. Thumb laceration, right, sequela   2. Wound dehiscence, subsequent encounter   Sutures were replaced 1 wk ago as after 2d after initial suture repair as laceration dehisced as sutures became untied and covered w/ Keflex. Lac healing overall but much slower than expected w/ still occ bleeding but only epiderus not fully healed.. Sutures again loose so removed and lac cleaned very well w/ soap and water sev times. Dermabond applied since pt cannot keep dressing on hands during work - to much heavy lifting.  Recheck 1 wk.  I personally performed the services described in this documentation, which was scribed in my presence. The recorded information has been reviewed and considered, and addended by me as needed.  Kevin Sorenson, MD MPH

## 2014-09-17 ENCOUNTER — Emergency Department (HOSPITAL_COMMUNITY): Payer: 59

## 2014-09-17 ENCOUNTER — Inpatient Hospital Stay (HOSPITAL_COMMUNITY)
Admission: EM | Admit: 2014-09-17 | Discharge: 2014-09-19 | DRG: 392 | Disposition: A | Discharge status: Home / Self Care | Payer: 59 | Attending: Internal Medicine | Admitting: Internal Medicine

## 2014-09-17 ENCOUNTER — Encounter (HOSPITAL_COMMUNITY): Payer: Self-pay | Admitting: *Deleted

## 2014-09-17 DIAGNOSIS — B351 Tinea unguium: Secondary | ICD-10-CM | POA: Diagnosis present

## 2014-09-17 DIAGNOSIS — L500 Allergic urticaria: Secondary | ICD-10-CM | POA: Diagnosis present

## 2014-09-17 DIAGNOSIS — K5792 Diverticulitis of intestine, part unspecified, without perforation or abscess without bleeding: Secondary | ICD-10-CM | POA: Diagnosis not present

## 2014-09-17 DIAGNOSIS — R109 Unspecified abdominal pain: Secondary | ICD-10-CM | POA: Diagnosis not present

## 2014-09-17 DIAGNOSIS — K5720 Diverticulitis of large intestine with perforation and abscess without bleeding: Secondary | ICD-10-CM | POA: Diagnosis not present

## 2014-09-17 DIAGNOSIS — E876 Hypokalemia: Secondary | ICD-10-CM | POA: Diagnosis present

## 2014-09-17 DIAGNOSIS — Z881 Allergy status to other antibiotic agents status: Secondary | ICD-10-CM

## 2014-09-17 HISTORY — DX: Tinea unguium: B35.1

## 2014-09-17 LAB — COMPREHENSIVE METABOLIC PANEL
ALBUMIN: 3.8 g/dL (ref 3.5–5.0)
ALT: 30 U/L (ref 17–63)
ANION GAP: 7 (ref 5–15)
AST: 20 U/L (ref 15–41)
Alkaline Phosphatase: 77 U/L (ref 38–126)
BUN: 17 mg/dL (ref 6–20)
CALCIUM: 8.5 mg/dL — AB (ref 8.9–10.3)
CO2: 25 mmol/L (ref 22–32)
Chloride: 104 mmol/L (ref 101–111)
Creatinine, Ser: 1.23 mg/dL (ref 0.61–1.24)
GFR calc Af Amer: >60 mL/min (ref >60)
GLUCOSE: 104 mg/dL — AB (ref 65–99)
POTASSIUM: 3.4 mmol/L — AB (ref 3.5–5.1)
SODIUM: 136 mmol/L (ref 135–145)
Total Bilirubin: 0.7 mg/dL (ref 0.3–1.2)
Total Protein: 6.9 g/dL (ref 6.5–8.1)

## 2014-09-17 LAB — URINALYSIS, ROUTINE W REFLEX MICROSCOPIC
BILIRUBIN URINE: NEGATIVE
GLUCOSE, UA: NEGATIVE mg/dL
HGB URINE DIPSTICK: NEGATIVE
KETONES UR: NEGATIVE mg/dL
Leukocytes, UA: NEGATIVE
NITRITE: NEGATIVE
PH: 6 (ref 5.0–8.0)
Protein, ur: NEGATIVE mg/dL
Specific Gravity, Urine: 1.028 (ref 1.005–1.030)
UROBILINOGEN UA: 0.2 mg/dL (ref 0.0–1.0)

## 2014-09-17 LAB — CBC WITH DIFFERENTIAL/PLATELET
BASOS ABS: 0 10*3/uL (ref 0.0–0.1)
Basophils Relative: 0 % (ref 0–1)
EOS ABS: 0.1 10*3/uL (ref 0.0–0.7)
Eosinophils Relative: 1 % (ref 0–5)
HCT: 41.9 % (ref 39.0–52.0)
Hemoglobin: 14.4 g/dL (ref 13.0–17.0)
LYMPHS ABS: 2 10*3/uL (ref 0.7–4.0)
LYMPHS PCT: 15 % (ref 12–46)
MCH: 31 pg (ref 26.0–34.0)
MCHC: 34.4 g/dL (ref 30.0–36.0)
MCV: 90.1 fL (ref 78.0–100.0)
MONO ABS: 1.3 10*3/uL — AB (ref 0.1–1.0)
Monocytes Relative: 9 % (ref 3–12)
Neutro Abs: 10.1 10*3/uL — ABNORMAL HIGH (ref 1.7–7.7)
Neutrophils Relative %: 75 % (ref 43–77)
Platelets: 212 10*3/uL (ref 150–400)
RBC: 4.65 MIL/uL (ref 4.22–5.81)
RDW: 12.9 % (ref 11.5–15.5)
WBC: 13.5 10*3/uL — AB (ref 4.0–10.5)

## 2014-09-17 LAB — LIPASE, BLOOD: Lipase: 17 U/L — ABNORMAL LOW (ref 22–51)

## 2014-09-17 MED ORDER — ACETAMINOPHEN 325 MG PO TABS
650.0000 mg | ORAL_TABLET | Freq: Four times a day (QID) | ORAL | Status: DC | PRN
  Administered 2014-09-17: 650 mg via ORAL
  Filled 2014-09-17: qty 2

## 2014-09-17 MED ORDER — OXYCODONE HCL 5 MG PO TABS
5.0000 mg | ORAL_TABLET | ORAL | Status: DC | PRN
  Administered 2014-09-17: 5 mg via ORAL
  Filled 2014-09-17: qty 1

## 2014-09-17 MED ORDER — IOHEXOL 300 MG/ML  SOLN
100.0000 mL | Freq: Once | INTRAMUSCULAR | Status: AC | PRN
  Administered 2014-09-17: 100 mL via INTRAVENOUS

## 2014-09-17 MED ORDER — IOHEXOL 300 MG/ML  SOLN
50.0000 mL | Freq: Once | INTRAMUSCULAR | Status: AC | PRN
  Administered 2014-09-17: 50 mL via ORAL

## 2014-09-17 MED ORDER — POTASSIUM CHLORIDE IN NACL 20-0.9 MEQ/L-% IV SOLN
INTRAVENOUS | Status: DC
  Administered 2014-09-17: 75 mL/h via INTRAVENOUS
  Administered 2014-09-19: 05:00:00 via INTRAVENOUS
  Filled 2014-09-17 (×5): qty 1000

## 2014-09-17 MED ORDER — METRONIDAZOLE IN NACL 5-0.79 MG/ML-% IV SOLN
500.0000 mg | Freq: Once | INTRAVENOUS | Status: DC
  Filled 2014-09-17: qty 100

## 2014-09-17 MED ORDER — LORATADINE 10 MG PO TABS
10.0000 mg | ORAL_TABLET | Freq: Every day | ORAL | Status: DC
  Administered 2014-09-17 – 2014-09-19 (×3): 10 mg via ORAL
  Filled 2014-09-17 (×3): qty 1

## 2014-09-17 MED ORDER — CIPROFLOXACIN IN D5W 400 MG/200ML IV SOLN
400.0000 mg | Freq: Once | INTRAVENOUS | Status: AC
  Administered 2014-09-17: 400 mg via INTRAVENOUS
  Filled 2014-09-17: qty 200

## 2014-09-17 MED ORDER — ALUM & MAG HYDROXIDE-SIMETH 200-200-20 MG/5ML PO SUSP: 30.0000 mL | Freq: Four times a day (QID) | ORAL | Status: DC | PRN

## 2014-09-17 MED ORDER — PROMETHAZINE HCL 25 MG PO TABS: 12.5000 mg | ORAL_TABLET | Freq: Four times a day (QID) | ORAL | Status: DC | PRN

## 2014-09-17 MED ORDER — PIPERACILLIN-TAZOBACTAM 3.375 G IVPB
3.3750 g | Freq: Four times a day (QID) | INTRAVENOUS | Status: DC
  Administered 2014-09-17 – 2014-09-18 (×3): 3.375 g via INTRAVENOUS
  Filled 2014-09-17 (×4): qty 50

## 2014-09-17 MED ORDER — ACETAMINOPHEN 650 MG RE SUPP: 650.0000 mg | Freq: Four times a day (QID) | RECTAL | Status: DC | PRN

## 2014-09-17 MED ORDER — MORPHINE SULFATE 4 MG/ML IJ SOLN: 4.0000 mg | INTRAMUSCULAR | Status: DC | PRN

## 2014-09-17 MED ORDER — GUAIFENESIN-DM 100-10 MG/5ML PO SYRP
5.0000 mL | ORAL_SOLUTION | ORAL | Status: DC | PRN
  Administered 2014-09-17 – 2014-09-18 (×2): 5 mL via ORAL
  Filled 2014-09-17 (×2): qty 10

## 2014-09-17 NOTE — H&P (Signed)
History and Physical:    Kevin Meyers   ZOX:[id number] DOB: [birth year] DOA: 09/17/2014  Referring MD/provider: Dr. Silverio Lay PCP: Kristian Covey, MD   Chief Complaint: Abdominal pain  History of Present Illness:   Kevin Meyers is an 39 y.o. male with no significant PMH who presents with < 24 history of abdominal pain, mid abdomen below umbilicus, rated 8/10 at worst, associated with loss of appetite and nausea but no vomiting, described as a general ache punctuated by sharp/shooting pain, worse with movement/activity, eases off with rest.  Upon initial evaluation in the ED, a CT scan of the abdomen and pelvis was obtained which showed acute diverticulitis with a microperforation.  ROS:   Review of Systems  Constitutional: Positive for chills and malaise/fatigue. Negative for fever and diaphoresis.  HENT: Positive for congestion and sore throat.   Eyes: Negative.   Respiratory: Positive for cough and sputum production.   Cardiovascular: Negative.   Gastrointestinal: Positive for nausea and abdominal pain. Negative for diarrhea, blood in stool and melena.  Genitourinary: Negative.   Musculoskeletal: Negative.   Skin: Positive for itching and rash.  Neurological: Negative.  Negative for weakness.  Endo/Heme/Allergies: Negative for environmental allergies.  Psychiatric/Behavioral: Negative.     Past Medical History:   Past Medical History  Diagnosis Date  . ONYCHOMYCOSIS 08/19/2008    Qualifier: Diagnosis of  By: Caryl Never MD, Bruce      Past Surgical History:   Past Surgical History  Procedure Laterality Date  . None      Social History:   History   Social History  . Marital Status: Single    Spouse Name: N/A  . Number of Children: 2  . Years of Education: N/A   Occupational History  . 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 Smoking status: Never Smoker   . Smokeless tobacco: Not on file  . Alcohol Use: Yes     Comment: 4 x a  week   . Drug Use: No  . Sexual Activity: Not on file   Other Topics Concern  . Not on file   Social History Narrative   Lives with a roommate.      Family history:   Family History  Problem Relation Age of Onset  . Diabetes Mother   . Hypertension Father     Allergies   Ciprofloxacin  Current Medications:   Prior to Admission medications   Medication Sig Start Date End Date Taking? Authorizing Provider  cetirizine (ZYRTEC) 10 MG tablet Take 1 tablet (10 mg total) by mouth at bedtime. 09/11/14  Yes Sherren Mocha, MD  cephALEXin (KEFLEX) 500 MG capsule Take 1 capsule (500 mg total) by mouth 2 (two) times daily. Patient not taking: Reported on 09/17/2014 09/04/14   Morrell Riddle, PA-C  fluticasone [facility]) 50 MCG/ACT nasal spray Place 2 sprays into both nostrils at bedtime. Patient not taking: Reported on 09/17/2014 09/11/14   Sherren Mocha, MD    Physical Exam:   Filed Vitals:   09/17/14 1117 09/17/14 1354 09/17/14 1630  BP: 116/75 124/83 123/75  Pulse: 92 81 79  Temp: 99.6 F (37.6 C)  98.9 F (37.2 C)  TempSrc: Oral  Oral  Resp: Height:    (1.753 m)  Weight:   79.017 kg (174 lb 3.2 oz)  SpO2: 98% 95% 97%     Physical Exam: Blood pressure 123/75, pulse 79, temperature 98.9 F (37.2  C), temperature source Oral, resp. rate 18, height 5\' 9"  (1.753 m), weight 79.017 kg (174 lb 3.2 oz), SpO2 97 %. Gen: No acute distress. Head: Normocephalic, atraumatic. Eyes: PERRL, EOMI, sclerae nonicteric. Mouth: Oropharynx clear. Neck: Supple, no thyromegaly, no lymphadenopathy, no jugular venous distention. Chest: Lungs are clear to auscultation bilaterally. CV: Heart sounds are regular. No murmurs, rubs, or gallops. Abdomen: Soft, mildly tender in the lower quadrants with hyperactive bowel sounds. Extremities: Extremities are without clubbing, edema, or cyanosis. Skin: Warm and dry. Neuro: Alert and oriented times 3; grossly nonfocal. Psych: Mood and affect  normal.   Data Review:    Labs: Basic Metabolic Panel:  Recent Labs Lab 09/17/14 1231  NA 136  K 3.4*  CL 104  CO2 25  GLUCOSE 104*  BUN 17  CREATININE 1.23  CALCIUM 8.5*   Liver Function Tests:  Recent Labs Lab 09/17/14 1231  AST 20  ALT 30  ALKPHOS 77  BILITOT 0.7  PROT 6.9  ALBUMIN 3.8    Recent Labs Lab 09/17/14 1231  LIPASE 17*   CBC:  Recent Labs Lab 09/17/14 1231  WBC 13.5*  NEUTROABS 10.1*  HGB 14.4  HCT 41.9  MCV 90.1  PLT 212   Radiographic Studies: Ct Abdomen Pelvis W Contrast  09/17/2014   CLINICAL DATA:  Left lower abdominal pain, lack of appetite since yesterday afternoon  EXAM: CT ABDOMEN AND PELVIS WITH CONTRAST  TECHNIQUE: Multidetector CT imaging of the abdomen and pelvis was performed using the standard protocol following bolus administration of intravenous contrast.  CONTRAST:  OMNIPAQUE IOHEXOL 300 MG/ML  SOLN  COMPARISON:  None.  FINDINGS: Lower chest:  Clear lung bases.  Normal heart size.  Hepatobiliary: Normal liver. Normal gallbladder. No intrahepatic or extrahepatic biliary ductal dilatation.  Pancreas: Normal.  Spleen: Normal.  Adrenals/Urinary Tract: Normal adrenal glands. Normal kidneys. No urolithiasis or obstructive uropathy. Normal bladder.  Stomach/Bowel: No bowel dilatation. Diverticulosis of the descending and sigmoid colon. There is bowel wall thickening of the descending colon-sigmoid colon junction with surrounding inflammatory changes and a small area of extraluminal air most consistent with acute diverticulitis with a small contained perforation. There is no very diverticular fluid collection. There is no other area of pneumoperitoneum, pneumatosis or portal venous gas.  Vascular/Lymphatic: Normal caliber abdominal aorta. No abdominal or pelvic lymphadenopathy.  Other: No fluid collection or hematoma.  Musculoskeletal: No acute osseous abnormality. No lytic or sclerotic osseous lesion.  IMPRESSION: 1. Acute  diverticulitis at the distal descending colon-sigmoid colon junction. Small amount of extraluminal air along the anti mesenteric aspect of the colon most consistent with a small contained perforation. No drainable fluid collection.   Electronically Signed   By: Elige Ko   On: 09/17/2014 14:28   *I have personally reviewed the images above*    Assessment/Plan:   Principal Problem:   Diverticulitis - Developed an acute urticarial reaction during Cipro infusion in the ED. - We'll treat with Zosyn. - Supportive care with pain medications, nausea medications as needed, IV fluids and a full liquid diet.  Active Problems:   Allergic urticaria - Developed along infusion route along the veins shortly after Cipro started. - Placed on daily Claritin.    Hypokalemia - Potassium added to IV fluids.    DVT prophylaxis - Low risk, SCDs and early ambulation.  Code Status: Full. Family Communication: No family currently at the bedside. Disposition Plan: Home when stable.  Time spent: 45 minutes.  RAMA,CHRISTINA [facility] Pager [phone] Cell:  [phone]   If 7PM-7AM, please contact night-coverage [email or web address] Password [email or web address] 09/17/2014, 5:29 PM

## 2014-09-17 NOTE — ED Notes (Signed)
15:50 Patient can go to floor...klj

## 2014-09-17 NOTE — ED Notes (Addendum)
Pt complains of left lower abdominal pain and lack of appetite since yesterday afternoon. Pt denies n/v/d. Pt states he also had chills last night, but did not take his temperature. Pt states the pain is worse with movement.

## 2014-09-17 NOTE — ED Provider Notes (Signed)
CSN: [id number]     Arrival date & time 09/17/14  1112 History   First MD Initiated Contact with Patient 09/17/14 1206     Chief Complaint  Patient presents with  . Abdominal Pain     (Consider location/radiation/quality/duration/timing/severity/associated sxs/prior Treatment) HPI Kevin Meyers is a 39 y.o. male comes in for evaluation of abdominal discomfort. Patient states at approximately 5:00 yesterday evening he began to experience a lower abdominal discomfort characterized as a sharp sensation that radiates across the middle of his lower abdomen and suprapubic region. Reports that he works a physical job at [employer] but denies any injury. Denies any fevers, cough, vomiting, urinary symptoms, changes in bowel habits or characteristics, penile discharge, scrotal swelling., Back pain. Does report intermittent chills. Rates the discomfort a 7/10 and is worse with movement and walking. No abdominal surgeries. Nothing makes this problem better  History reviewed. No pertinent past medical history. Past Surgical History  Procedure Laterality Date  . None     Family History  Problem Relation Age of Onset  . Diabetes Mother   . Hypertension Father    History  Substance Use Topics  . Smoking status: Never Smoker   . Smokeless tobacco: Not on file  . Alcohol Use: Yes     Comment: 4 x a week     Review of Systems A 10 point review of systems was completed and was negative except for pertinent positives and negatives as mentioned in the history of present illness     Allergies  Ciprofloxacin  Home Medications   Prior to Admission medications   Medication Sig Start Date End Date Taking? Authorizing Provider  cetirizine (ZYRTEC) 10 MG tablet Take 1 tablet (10 mg total) by mouth at bedtime. 09/11/14  Yes Sherren Mocha, MD  cephALEXin (KEFLEX) 500 MG capsule Take 1 capsule (500 mg total) by mouth 2 (two) times daily. Patient not taking: Reported on 09/17/2014 09/04/14   Morrell Riddle, PA-C  fluticasone [facility]) 50 MCG/ACT nasal spray Place 2 sprays into both nostrils at bedtime. Patient not taking: Reported on 09/17/2014 09/11/14   Sherren Mocha, MD   BP 124/83 mmHg  Pulse 81  Temp(Src) 99.6 F (37.6 C) (Oral)  Resp 18  SpO2 95% Physical Exam  Constitutional: He is oriented to person, place, and time. He appears well-developed and well-nourished.  HENT:  Head: Normocephalic and atraumatic.  Mouth/Throat: Oropharynx is clear and moist.  Eyes: Conjunctivae are normal. Pupils are equal, round, and reactive to light. Right eye exhibits no discharge. Left eye exhibits no discharge. No scleral icterus.  Neck: Neck supple.  Cardiovascular: Normal rate, regular rhythm and normal heart sounds.   Pulmonary/Chest: Effort normal and breath sounds normal. No respiratory distress. He has no wheezes. He has no rales.  Abdominal: Soft.  Abdomen is soft, nondistended. There is exquisite tenderness with palpation of lower abdomen and suprapubic region. No obvious lesions or deformities noted.  Genitourinary:  Penis normal testicles normal. No evidence of herniation.  Musculoskeletal: He exhibits no tenderness.  Neurological: He is alert and oriented to person, place, and time.  Cranial Nerves II-XII grossly intact  Skin: Skin is warm and dry. No rash noted.  Psychiatric: He has a normal mood and affect.  Nursing note and vitals reviewed.   ED Course  Procedures (including critical care time) Labs Review Labs Reviewed  CBC WITH DIFFERENTIAL/PLATELET - Abnormal; Notable for the following:    WBC 13.5 (*)    Neutro  Abs 10.1 (*)    Monocytes Absolute 1.3 (*)    All other components within normal limits  COMPREHENSIVE METABOLIC PANEL - Abnormal; Notable for the following:    Potassium 3.4 (*)    Glucose, Bld 104 (*)    Calcium 8.5 (*)    All other components within normal limits  LIPASE, BLOOD - Abnormal; Notable for the following:    Lipase 17 (*)    All other  components within normal limits  URINALYSIS, ROUTINE W REFLEX MICROSCOPIC (NOT AT [facility])    Imaging Review Ct Abdomen Pelvis W Contrast  09/17/2014   CLINICAL DATA:  Left lower abdominal pain, lack of appetite since yesterday afternoon  EXAM: CT ABDOMEN AND PELVIS WITH CONTRAST  TECHNIQUE: Multidetector CT imaging of the abdomen and pelvis was performed using the standard protocol following bolus administration of intravenous contrast.  CONTRAST:  OMNIPAQUE IOHEXOL 300 MG/ML  SOLN  COMPARISON:  None.  FINDINGS: Lower chest:  Clear lung bases.  Normal heart size.  Hepatobiliary: Normal liver. Normal gallbladder. No intrahepatic or extrahepatic biliary ductal dilatation.  Pancreas: Normal.  Spleen: Normal.  Adrenals/Urinary Tract: Normal adrenal glands. Normal kidneys. No urolithiasis or obstructive uropathy. Normal bladder.  Stomach/Bowel: No bowel dilatation. Diverticulosis of the descending and sigmoid colon. There is bowel wall thickening of the descending colon-sigmoid colon junction with surrounding inflammatory changes and a small area of extraluminal air most consistent with acute diverticulitis with a small contained perforation. There is no very diverticular fluid collection. There is no other area of pneumoperitoneum, pneumatosis or portal venous gas.  Vascular/Lymphatic: Normal caliber abdominal aorta. No abdominal or pelvic lymphadenopathy.  Other: No fluid collection or hematoma.  Musculoskeletal: No acute osseous abnormality. No lytic or sclerotic osseous lesion.  IMPRESSION: 1. Acute diverticulitis at the distal descending colon-sigmoid colon junction. Small amount of extraluminal air along the anti mesenteric aspect of the colon most consistent with a small contained perforation. No drainable fluid collection.   Electronically Signed   By: Elige Ko   On: 09/17/2014 14:28     EKG Interpretation None     Meds given in ED:  Medications  metroNIDAZOLE (FLAGYL) IVPB 500 mg (not  administered)  loratadine (CLARITIN) tablet 10 mg (not administered)  0.9 % NaCl with KCl 20 mEq/ L  infusion (not administered)  acetaminophen (TYLENOL) tablet 650 mg (not administered)    Or  acetaminophen (TYLENOL) suppository 650 mg (not administered)  oxyCODONE (Oxy IR/ROXICODONE) immediate release tablet 5 mg (not administered)  morphine 4 MG/ML injection 4 mg (not administered)  promethazine (PHENERGAN) tablet 12.5 mg (not administered)  alum & mag hydroxide-simeth (MAALOX/MYLANTA) 200-200-20 MG/5ML suspension 30 mL (not administered)  piperacillin-tazobactam (ZOSYN) IVPB 3.375 g (not administered)  guaiFENesin-dextromethorphan (ROBITUSSIN DM) 100-10 MG/5ML syrup 5 mL (not administered)  iohexol (OMNIPAQUE) 300 MG/ML solution 50 mL (50 mLs Oral Contrast Given 09/17/14 1330)  iohexol (OMNIPAQUE) 300 MG/ML solution 100 mL (100 mLs Intravenous Contrast Given 09/17/14 1402)  ciprofloxacin (CIPRO) IVPB 400 mg (0 mg Intravenous Stopped 09/17/14 1606)    New Prescriptions   No medications on file   Filed Vitals:   09/17/14 1117 09/17/14 1354  BP: 116/75 124/83  Pulse: 92 81  Temp: 99.6 F (37.6 C)   TempSrc: Oral   Resp: 20 18  SpO2: 98% 95%    MDM  Otherwise healthy male here for evaluation of abdominal pain. Two-day history of lower abdominal pain. Screening labs showed leukocytosis of 13.5, otherwise noncontributory. Physical exam--exquisitely tender  in lower abdomen. Obtain CT abdomen pelvis to further elucidate source of patient's symptoms. CT shows acute diverticulitis with very small, contained perforation without abscess. Vital signs remained stable. Pain managed in the ED. Started on IV Cipro Flagyl.. Discussed patient presentation and ED case my attending, Dr. Silverio Lay who agrees with consult to medicine for admission. Consult to hospitalist, Dr. Darnelle Catalan, patient admitted. I personally reviewed the imaging and agree with the results as interpreted by the radiologist.  Final  diagnoses:  Acute diverticulitis        Joycie Peek, PA-C 09/18/14 7829  Richardean Canal, MD 09/18/14 [phone]

## 2014-09-17 NOTE — ED Notes (Signed)
Approximately 1/3 of the way through the Cipro piggyback, he developed a mildly pruritic rash at and proximal to IV site.  Dr. Darnelle Catalan halted the Cipro infusion and told me she will be ordering different antibiotic(s).  Pt. Is in no distress and denies any throat tightness, nor any trouble breathing.

## 2014-09-17 NOTE — ED Notes (Signed)
Patient transported to CT 

## 2014-09-18 LAB — CBC
HEMATOCRIT: 43.2 % (ref 39.0–52.0)
HEMOGLOBIN: 14 g/dL (ref 13.0–17.0)
MCH: 29.5 pg (ref 26.0–34.0)
MCHC: 32.4 g/dL (ref 30.0–36.0)
MCV: 91.1 fL (ref 78.0–100.0)
Platelets: 215 10*3/uL (ref 150–400)
RBC: 4.74 MIL/uL (ref 4.22–5.81)
RDW: 12.8 % (ref 11.5–15.5)
WBC: 11.9 10*3/uL — AB (ref 4.0–10.5)

## 2014-09-18 LAB — BASIC METABOLIC PANEL
ANION GAP: 7 (ref 5–15)
BUN: 11 mg/dL (ref 6–20)
CALCIUM: 8.4 mg/dL — AB (ref 8.9–10.3)
CO2: 27 mmol/L (ref 22–32)
Chloride: 105 mmol/L (ref 101–111)
Creatinine, Ser: 1.35 mg/dL — ABNORMAL HIGH (ref 0.61–1.24)
GFR calc Af Amer: >60 mL/min (ref >60)
Glucose, Bld: 105 mg/dL — ABNORMAL HIGH (ref 65–99)
POTASSIUM: 3.7 mmol/L (ref 3.5–5.1)
Sodium: 139 mmol/L (ref 135–145)

## 2014-09-18 MED ORDER — PIPERACILLIN-TAZOBACTAM 3.375 G IVPB
3.3750 g | Freq: Three times a day (TID) | INTRAVENOUS | Status: DC
  Administered 2014-09-18 – 2014-09-19 (×3): 3.375 g via INTRAVENOUS
  Filled 2014-09-18 (×4): qty 50

## 2014-09-18 MED ORDER — SACCHAROMYCES BOULARDII 250 MG PO CAPS
250.0000 mg | ORAL_CAPSULE | Freq: Two times a day (BID) | ORAL | Status: DC
  Administered 2014-09-18 – 2014-09-19 (×3): 250 mg via ORAL
  Filled 2014-09-18 (×4): qty 1

## 2014-09-18 NOTE — Progress Notes (Signed)
Utilization review completed.  

## 2014-09-18 NOTE — Progress Notes (Signed)
Progress Note   Kevin Meyers ZOX:[id number] DOB: [birth year] DOA: 09/17/2014 PCP: Kristian Covey, MD   Brief Narrative:   Kevin Meyers is an 39 y.o. male with no significant PMH who was admitted 09/17/14 with acute diverticulitis.  Assessment/Plan:   Principal Problem:  Diverticulitis - Developed an acute urticarial reaction during Cipro infusion in the ED. - Continue Zosyn. Can likely discharge home on Augmentin if stable over the next 24-48 hours. - Supportive care with pain medications, nausea medications as needed, IV fluids. - Soft diet.  Active Problems:  Allergic urticaria - Developed along infusion route along the veins shortly after Cipro started. - Placed on daily Claritin.   Hypokalemia - Potassium added to IV fluids. Potassium normal today.   DVT prophylaxis - Low risk, SCDs and early ambulation.  Family Communication: No family currently at the bedside. Disposition Plan: Home when stable and transitioned to oral antibiotics, likely another 24-48 hours. Code Status:     Code Status Orders        Start     Ordered   09/17/14 1607  Full code   Continuous     09/17/14 1611      IV Access:    Peripheral IV   Procedures and diagnostic studies:   Ct Abdomen Pelvis W Contrast  09/17/2014   CLINICAL DATA:  Left lower abdominal pain, lack of appetite since yesterday afternoon  EXAM: CT ABDOMEN AND PELVIS WITH CONTRAST  TECHNIQUE: Multidetector CT imaging of the abdomen and pelvis was performed using the standard protocol following bolus administration of intravenous contrast.  CONTRAST:  OMNIPAQUE IOHEXOL 300 MG/ML  SOLN  COMPARISON:  None.  FINDINGS: Lower chest:  Clear lung bases.  Normal heart size.  Hepatobiliary: Normal liver. Normal gallbladder. No intrahepatic or extrahepatic biliary ductal dilatation.  Pancreas: Normal.  Spleen: Normal.  Adrenals/Urinary Tract: Normal adrenal glands. Normal kidneys. No urolithiasis or  obstructive uropathy. Normal bladder.  Stomach/Bowel: No bowel dilatation. Diverticulosis of the descending and sigmoid colon. There is bowel wall thickening of the descending colon-sigmoid colon junction with surrounding inflammatory changes and a small area of extraluminal air most consistent with acute diverticulitis with a small contained perforation. There is no very diverticular fluid collection. There is no other area of pneumoperitoneum, pneumatosis or portal venous gas.  Vascular/Lymphatic: Normal caliber abdominal aorta. No abdominal or pelvic lymphadenopathy.  Other: No fluid collection or hematoma.  Musculoskeletal: No acute osseous abnormality. No lytic or sclerotic osseous lesion.  IMPRESSION: 1. Acute diverticulitis at the distal descending colon-sigmoid colon junction. Small amount of extraluminal air along the anti mesenteric aspect of the colon most consistent with a small contained perforation. No drainable fluid collection.   Electronically Signed   By: Elige Ko   On: 09/17/2014 14:28    Medical Consultants:    None.  Anti-Infectives:    Zosyn 09/17/14--->  Subjective:    Kevin Meyers had a diaphoretic spell last night.  No BMs.  No nausea or vomiting.  Abdominal pain 8/10 at worst, no pain at present, and overall feels that pain is improved.  Objective:    Filed Vitals:   09/17/14 1630 09/17/14 2300 09/18/14 0050 09/18/14 0400  BP: 123/75 120/51 115/74 100/53  Pulse: 79 86 70 67  Temp: 98.9 F (37.2 C) 99.8 F (37.7 C) 98.1 F (36.7 C) 98.3 F (36.8 C)  TempSrc: Oral Oral Oral Oral  Resp: 18 18 18 18   Height: 5\' 9"  (1.753 m)  Weight: 79.017 kg (174 lb 3.2 oz)     SpO2: 97% 97% 97% 100%    Intake/Output Summary (Last 24 hours) at 09/18/14 1228 Last data filed at 09/18/14 1012  Gross per 24 hour  Intake   1380 ml  Output    700 ml  Net    680 ml    Exam: Gen:  NAD Cardiovascular:  RRR, No M/R/G Respiratory:  Lungs CTAB Gastrointestinal:   Abdomen soft, minimally tender, + BS Extremities:  No C/E/C   Data Reviewed:    Labs: Basic Metabolic Panel:  Recent Labs Lab 09/17/14 1231 09/18/14 0633  NA 136 139  K 3.4* 3.7  CL 104 105  CO2 25 27  GLUCOSE 104* 105*  BUN 17 11  CREATININE 1.23 1.35*  CALCIUM 8.5* 8.4*   GFR Estimated Creatinine Clearance: 74.9 mL/min (by C-G formula based on Cr of 1.35). Liver Function Tests:  Recent Labs Lab 09/17/14 1231  AST 20  ALT 30  ALKPHOS 77  BILITOT 0.7  PROT 6.9  ALBUMIN 3.8    Recent Labs Lab 09/17/14 1231  LIPASE 17*   CBC:  Recent Labs Lab 09/17/14 1231 09/18/14 0633  WBC 13.5* 11.9*  NEUTROABS 10.1*  --   HGB 14.4 14.0  HCT 41.9 43.2  MCV 90.1 91.1  PLT 212 215    Microbiology No results found for this or any previous visit (from the past 240 hour(s)).   Medications:   . loratadine  10 mg Oral Daily  . piperacillin-tazobactam (ZOSYN)  IV  3.375 g Intravenous Q8H   Continuous Infusions: . 0.9 % NaCl with KCl 20 mEq / L 75 mL/hr (09/17/14 1712)    Time spent: 25 minutes.   LOS: 1 day   RAMA,CHRISTINA  [facility] Pager [phone]. If unable to reach me by pager, please call my cell phone at [phone].  *Please refer to [email or web address], password [email or web address] to get updated schedule on who will round on this patient, as hospitalists switch teams weekly. If 7PM-7AM, please contact night-coverage at [email or web address], password [email or web address] for any overnight needs.  09/18/2014, 12:28 PM

## 2014-09-19 MED ORDER — AMOXICILLIN-POT CLAVULANATE 875-125 MG PO TABS: 1.0000 | ORAL_TABLET | Freq: Two times a day (BID) | ORAL | Status: AC

## 2014-09-19 MED ORDER — DEXTROMETHORPHAN POLISTIREX ER 30 MG/5ML PO SUER: 30.0000 mg | Freq: Two times a day (BID) | ORAL | Status: AC | PRN

## 2014-09-19 MED ORDER — OXYCODONE HCL 5 MG PO TABS: 5.0000 mg | ORAL_TABLET | ORAL | Status: AC | PRN

## 2014-09-19 MED ORDER — SACCHAROMYCES BOULARDII 250 MG PO CAPS: 250.0000 mg | ORAL_CAPSULE | Freq: Two times a day (BID) | ORAL | Status: AC

## 2014-09-19 NOTE — Discharge Instructions (Signed)

## 2014-09-19 NOTE — Discharge Summary (Signed)
Physician Discharge Summary  Kevin Meyers ZOX:[id number] DOB: [birth year] DOA: 09/17/2014  PCP: Kristian Covey, MD  Admit date: 09/17/2014 Discharge date: 09/19/2014   Recommendations for Outpatient Follow-Up:   1. The patient will follow-up at the urgent care center for nonresolution of symptoms after completion of antibiotics.   Discharge Diagnosis:   Principal Problem:    Acute Diverticulitis Active Problems:    Allergic urticaria    Hypokalemia   Discharge disposition:  Home.    Discharge Condition: Improved.  Diet recommendation: Regular.  Wound care: None.   History of Present Illness:   Kevin Meyers is an 39 y.o. male with no significant PMH who was admitted 09/17/14 with acute diverticulitis.   Hospital Course by Problem:   Principal Problem:  Diverticulitis - Developed an acute urticarial reaction during Cipro infusion in the ED. - Treated with IV Zosyn with gradual improvement in abdominal pain.  - We'll discharge home on a seven-day course of Augmentin.  Active Problems:  Allergic urticaria - Developed along infusion route along the veins shortly after Cipro started. - Placed on daily Claritin. Rash resolved at discharge.   Hypokalemia - Potassium added to IV fluids. Potassium normal on recheck.    Medical Consultants:    None.   Discharge Exam:   Filed Vitals:   09/19/14 0647  BP: 117/78  Pulse: 60  Temp:   Resp:    Filed Vitals:   09/18/14 1412 09/18/14 2200 09/19/14 0532 09/19/14 0647  BP: 120/72 137/84 102/33 117/78  Pulse: 68 71 72 60  Temp: 99 F (37.2 C) 99.8 F (37.7 C) 98 F (36.7 C)   TempSrc: Oral Oral Oral   Resp: 18 18 18    Height:      Weight:      SpO2: 99% 100% 99%     Gen:  NAD Cardiovascular:  RRR, No M/R/G Respiratory: Lungs CTAB Gastrointestinal: Abdomen soft, NT/ND with normal active bowel sounds. Extremities: No C/E/C   The results of significant diagnostics from this  hospitalization (including imaging, microbiology, ancillary and laboratory) are listed below for reference.     Procedures and Diagnostic Studies:   Ct Abdomen Pelvis W Contrast  09/17/2014   CLINICAL DATA:  Left lower abdominal pain, lack of appetite since yesterday afternoon  EXAM: CT ABDOMEN AND PELVIS WITH CONTRAST  TECHNIQUE: Multidetector CT imaging of the abdomen and pelvis was performed using the standard protocol following bolus administration of intravenous contrast.  CONTRAST:  OMNIPAQUE IOHEXOL 300 MG/ML  SOLN  COMPARISON:  None.  FINDINGS: Lower chest:  Clear lung bases.  Normal heart size.  Hepatobiliary: Normal liver. Normal gallbladder. No intrahepatic or extrahepatic biliary ductal dilatation.  Pancreas: Normal.  Spleen: Normal.  Adrenals/Urinary Tract: Normal adrenal glands. Normal kidneys. No urolithiasis or obstructive uropathy. Normal bladder.  Stomach/Bowel: No bowel dilatation. Diverticulosis of the descending and sigmoid colon. There is bowel wall thickening of the descending colon-sigmoid colon junction with surrounding inflammatory changes and a small area of extraluminal air most consistent with acute diverticulitis with a small contained perforation. There is no very diverticular fluid collection. There is no other area of pneumoperitoneum, pneumatosis or portal venous gas.  Vascular/Lymphatic: Normal caliber abdominal aorta. No abdominal or pelvic lymphadenopathy.  Other: No fluid collection or hematoma.  Musculoskeletal: No acute osseous abnormality. No lytic or sclerotic osseous lesion.  IMPRESSION: 1. Acute diverticulitis at the distal descending colon-sigmoid colon junction. Small amount of extraluminal air along the anti mesenteric aspect of the colon  most consistent with a small contained perforation. No drainable fluid collection.   Electronically Signed   By: Elige Ko   On: 09/17/2014 14:28     Labs:   Basic Metabolic Panel:  Recent Labs Lab  09/17/14 1231 09/18/14 0633  NA 136 139  K 3.4* 3.7  CL 104 105  CO2 25 27  GLUCOSE 104* 105*  BUN 17 11  CREATININE 1.23 1.35*  CALCIUM 8.5* 8.4*   GFR Estimated Creatinine Clearance: 74.9 mL/min (by C-G formula based on Cr of 1.35). Liver Function Tests:  Recent Labs Lab 09/17/14 1231  AST 20  ALT 30  ALKPHOS 77  BILITOT 0.7  PROT 6.9  ALBUMIN 3.8    Recent Labs Lab 09/17/14 1231  LIPASE 17*   CBC:  Recent Labs Lab 09/17/14 1231 09/18/14 0633  WBC 13.5* 11.9*  NEUTROABS 10.1*  --   HGB 14.4 14.0  HCT 41.9 43.2  MCV 90.1 91.1  PLT 212 215     Discharge Instructions:   Discharge Instructions    Call MD for:  extreme fatigue    Complete by:  As directed      Call MD for:  persistant nausea and vomiting    Complete by:  As directed      Call MD for:  severe uncontrolled pain    Complete by:  As directed      Call MD for:  temperature >100.4    Complete by:  As directed      Diet general    Complete by:  As directed      Discharge instructions    Complete by:  As directed   If your pain is no better, or seems to be getting worse, go to the Urgent Care to schedule a repeat CT to make sure you haven't developed an abscess.  Take all of your antibiotics as prescribed.  Do not stop taking them until you finish the course, even if you feel better.     Increase activity slowly    Complete by:  As directed             Medication List    STOP taking these medications        cephALEXin 500 MG capsule  Commonly known as:  KEFLEX     fluticasone 50 MCG/ACT nasal spray  Commonly known as:  FLONASE      TAKE these medications        amoxicillin-clavulanate 875-125 MG per tablet  Commonly known as:  AUGMENTIN  Take 1 tablet by mouth 2 (two) times daily.     cetirizine 10 MG tablet  Commonly known as:  ZYRTEC  Take 1 tablet (10 mg total) by mouth at bedtime.     dextromethorphan 30 MG/5ML liquid  Commonly known as:  DELSYM  Take 5 mLs (30 mg  total) by mouth 2 (two) times daily as needed for cough.     oxyCODONE 5 MG immediate release tablet  Commonly known as:  Oxy IR/ROXICODONE  Take 1 tablet (5 mg total) by mouth every 4 (four) hours as needed for moderate pain.     saccharomyces boulardii 250 MG capsule  Commonly known as:  FLORASTOR  Take 1 capsule (250 mg total) by mouth 2 (two) times daily.           Follow-up Information    Schedule an appointment as soon as possible for a visit with Urgent Care Center.   Why:  If symptoms  worsen       Time coordinating discharge: 25 minutes.  Signed:  Angelene Rome  Pager [phone] [facility] 09/19/2014, 12:59 PM

## 2016-11-10 ENCOUNTER — Encounter: Payer: Self-pay | Admitting: Family Medicine
